# Patient Record
Sex: Male | Born: 1970 | Race: White | Hispanic: No | Marital: Married | Smoking: Never smoker
Health system: Southern US, Community
[De-identification: ages and names within clinical notes are randomized; demographics above are authoritative.]

## PROBLEM LIST (undated history)

## (undated) HISTORY — PX: HAND SURGERY: SHX662

## (undated) HISTORY — PX: VASECTOMY: SHX75

---

## 2010-03-17 LAB — URINALYSIS, ROUTINE W REFLEX MICROSCOPIC
Bilirubin Urine: NEGATIVE
Hgb urine dipstick: NEGATIVE
Specific Gravity, Urine: 1.01 (ref 1.005–1.030)
Urobilinogen, UA: 0.2 mg/dL (ref 0.0–1.0)

## 2010-03-17 LAB — COMPREHENSIVE METABOLIC PANEL
ALT: 21 U/L (ref 0–53)
Alkaline Phosphatase: 51 U/L (ref 39–117)
CO2: 30 mEq/L (ref 19–32)
Calcium: 10 mg/dL (ref 8.4–10.5)
GFR calc non Af Amer: >60 mL/min (ref >60)
Glucose, Bld: 93 mg/dL (ref 70–99)
Potassium: 4.6 mEq/L (ref 3.5–5.1)
Sodium: 139 mEq/L (ref 135–145)

## 2010-03-17 LAB — CBC
HCT: 43.4 % (ref 39.0–52.0)
MCH: 30.8 pg (ref 26.0–34.0)
MCHC: 35.5 g/dL (ref 30.0–36.0)
MCV: 86.8 fL (ref 78.0–100.0)
RDW: 12.6 % (ref 11.5–15.5)
WBC: 6.9 10*3/uL (ref 4.0–10.5)

## 2010-03-17 LAB — PROTIME-INR: Prothrombin Time: 13.3 seconds (ref 11.6–15.2)

## 2010-03-23 ENCOUNTER — Observation Stay (HOSPITAL_COMMUNITY)
Admission: RE | Admit: 2010-03-23 | Discharge: 2010-03-24 | Disposition: A | Discharge status: Home / Self Care | Payer: Managed Care, Other (non HMO) | Attending: Specialist | Admitting: Specialist

## 2010-03-23 ENCOUNTER — Inpatient Hospital Stay (HOSPITAL_COMMUNITY): Payer: Managed Care, Other (non HMO)

## 2010-03-23 ENCOUNTER — Other Ambulatory Visit: Payer: Self-pay | Admitting: Specialist

## 2010-03-23 DIAGNOSIS — Z2239 Carrier of other specified bacterial diseases: Secondary | ICD-10-CM | POA: Insufficient documentation

## 2010-03-23 DIAGNOSIS — IMO0001 Reserved for inherently not codable concepts without codable children: Secondary | ICD-10-CM | POA: Insufficient documentation

## 2010-03-23 DIAGNOSIS — M549 Dorsalgia, unspecified: Secondary | ICD-10-CM | POA: Insufficient documentation

## 2010-03-23 DIAGNOSIS — M4800 Spinal stenosis, site unspecified: Secondary | ICD-10-CM | POA: Insufficient documentation

## 2010-03-23 DIAGNOSIS — Z01818 Encounter for other preprocedural examination: Secondary | ICD-10-CM | POA: Insufficient documentation

## 2010-03-23 DIAGNOSIS — I498 Other specified cardiac arrhythmias: Secondary | ICD-10-CM | POA: Insufficient documentation

## 2010-03-23 DIAGNOSIS — M51379 Other intervertebral disc degeneration, lumbosacral region without mention of lumbar back pain or lower extremity pain: Secondary | ICD-10-CM | POA: Insufficient documentation

## 2010-03-23 DIAGNOSIS — M5137 Other intervertebral disc degeneration, lumbosacral region: Secondary | ICD-10-CM | POA: Insufficient documentation

## 2010-03-23 DIAGNOSIS — M79609 Pain in unspecified limb: Secondary | ICD-10-CM | POA: Insufficient documentation

## 2010-03-23 DIAGNOSIS — M5126 Other intervertebral disc displacement, lumbar region: Principal | ICD-10-CM | POA: Insufficient documentation

## 2010-03-23 DIAGNOSIS — Z2089 Contact with and (suspected) exposure to other communicable diseases: Secondary | ICD-10-CM | POA: Insufficient documentation

## 2010-03-23 HISTORY — PX: BACK SURGERY: SHX140

## 2010-03-28 NOTE — Op Note (Signed)
NAMEELDRIGE, PITKIN           ACCOUNT NO.:  [id number]  MEDICAL RECORD NO.:  [id number]           PATIENT TYPE:  I  LOCATION:  0002                         FACILITY:  [facility]  PHYSICIAN:  Jene Every, M.D.    DATE OF BIRTH:  1970/04/17  DATE OF PROCEDURE:  03/23/2010 DATE OF DISCHARGE:                              OPERATIVE REPORT   PREOPERATIVE DIAGNOSES:  Spinal stenosis and herniated nucleus pulposus, L4-L5, right.  POSTOPERATIVE DIAGNOSES:  Spinal stenosis and herniated nucleus pulposus, L4-L5, right.  PROCEDURE PERFORMED: 1. Microdecompression, L4-5, right. 2. Foraminotomy of L5, right. 3. Microdiskectomy of L4-L5, right. 4. Lysis of epidural venous plexus, L4-L5, right.  ANESTHESIA:  General.  ASSISTANT:  Roma Schanz, P.A.  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  Indications for decompression of the L5 root. He had minimal back pain, predominantly leg pain.  He had disk generation at L4-L5 without instability.  We discussed decompression of the L5 root to reduce his buttock and leg pain.  Risks and benefits were discussed including bleeding, infection, damage to vascular structures, CSF leakage, epidural fibrosis, adjacent segment disease, need for fusion in the future, DVT, PE, anesthetic complications etc.  He is preoperatively positive for staph, negative for MRSA; however, he had contact in a locker room playing hockey.  We therefore felt appropriate to prophylax with clindamycin.  TECHNIQUE:  With the patient in supine position after induction of adequate general anesthesia, 2 grams of Kefzol and 600 mg clindamycin. He was placed prone on the Williamson frame.  All bony prominences were well-padded.  Lumbar region was prepped and draped in the usual  sterile fashion.  An 18-gauge spinal needle was utilized to localize L4-L5 interspace confirmed with x-ray.  Incision was made in the spinous process, L4-L5.  Subcutaneous tissue was dissected.  Electrocautery was utilized to achieve hemostasis.  Dorsolumbar fascia was identified, divided in line with skin incision.  Paraspinous muscle elevated from lamina of L4-L5.  McCullough retractor was placed.  Second confirmatory radiograph obtained with Penfield 4 in the interlaminar space.  Straight curette was utilized to detach ligamentum flavum from the cephalad edge of L5.  Performed a hemilaminotomy in the caudad edge of L4 preserving the pars, detached ligamentum flavum.  Ligamentum flavum removed from the interspace.  Hypertrophic facet was noted as well with the neural elements well protected. Performed foraminotomy of L5, identified the L5 root, gently mobilized it medially knowing a vascular leash tethering it to the foramen of L4.  This was cauterized and divided, improving the mobility of the fibroid.  However, a focal HNP was noted with thecombination of soft and hard disk with an osteophytic ridge of the vertebral endplate of L4.  Performed annulotomy.  Copious portion of disk material was removed from the disk space with straight and upbiting pituitary.  This was sent to pathology.  Further mobilized with a nerve hook and an Epstein.  There was a portion of an osteophytic ridge, however, that remained.  The nerve root, however, was freely  mobile at this point in time and had at least a centimeter of excursion medial to the pedicle without difficulty.  Copiously irrigated disk space with antibiotic irrigation.  Hockey-stick probe passed freely up to foramen L5, L4 cephalad to the pedicle of L4 beneath thecal sac.  Axilla root and the shoulder root were checked, no evidence of neural compressive or herniated disk material.  Inspection revealed no CSF leakage or active bleeding.   Copiously irrigated disk space again, placed bone wax along the cancellous surfaces, removed the McCullough retractor after confirmatory radiographs obtained and Penfield 4 in the interlaminar space.  This confirmed L4-L5.  No CSF leakage or epidural bleeding. Copiously irrigated the paraspinous tissue.  Repaired the dorsolumbar fascia with #1 Vicryl in interrupted figure-of-eight sutures, subcutaneous with 2-0 Vicryl simple sutures.  Skin was reapproximated with 4-0 subcuticular Prolene.  Wound reinforced with Steri-Strips. Sterile dressing applied.  He was placed supine on the hospital bed, extubated without difficulty, and transported to the recovery in satisfactory condition.  The patient tolerated the procedure well, no complications.  BLOOD LOSS:  Minimal.     Jene Every, M.D.     Cordelia Pen  D:  03/23/2010  T:  03/23/2010  Job:  [id number]  Electronically Signed by Jene Every M.D. on 03/28/2010 02:15:33 PM

## 2010-06-27 NOTE — Discharge Summary (Signed)
  [name], Francis Mcdonald           ACCOUNT NO.:  [id number]  MEDICAL RECORD NO.:  [id number]           PATIENT TYPE:  I  LOCATION:  1607                         FACILITY:  [facility]  PHYSICIAN:  Jene Every, M.D.    DATE OF BIRTH:  11-23-1970  DATE OF ADMISSION:  03/23/2010 DATE OF DISCHARGE:  03/24/2010                              DISCHARGE SUMMARY   ADMISSION DIAGNOSIS:  Includes spinal stenosis and herniated nucleus pulposus, L4-L5 on the right.  DISCHARGE DIAGNOSIS:  Includes spinal stenosis and herniated nucleus pulposus, L4-L5 on the right, status post lumbar decompression and microdiskectomy at L4-L5 on the right.  HOSPITAL COURSE:  Uneventful.  DISPOSITION:  The patient stable to be discharged home.  ACTIVITIES:  Ambulate as tolerated utilizing back precautions.  He is to follow up with Dr. Shelle Iron in approximately 10-14 days postoperatively.  DIET:  As tolerated.  MEDICATIONS:  As per med rec sheet.CONDITION ON DISCHARGE:  Stable.  FINAL DIAGNOSIS:  Doing well status post lumbar decompression and microdiskectomy at L4-L5 on the right.     Roma Schanz, P.A.   ______________________________ Jene Every, M.D.    CS/MEDQ  D:  06/07/2010  T:  06/08/2010  Job:  [id number]  Electronically Signed by Roma Schanz P.A. on 06/14/2010 05:53:26 PM Electronically Signed by Jene Every M.D. on 06/27/2010 02:23:15 PM

## 2010-08-31 ENCOUNTER — Ambulatory Visit (INDEPENDENT_AMBULATORY_CARE_PROVIDER_SITE_OTHER): Payer: Managed Care, Other (non HMO) | Admitting: Surgery

## 2010-08-31 ENCOUNTER — Encounter (INDEPENDENT_AMBULATORY_CARE_PROVIDER_SITE_OTHER): Payer: Self-pay | Admitting: Surgery

## 2010-08-31 VITALS — BP 132/86 | HR 78 | Temp 96.2°F | Ht 72.0 in | Wt 206.2 lb

## 2010-08-31 DIAGNOSIS — K429 Umbilical hernia without obstruction or gangrene: Secondary | ICD-10-CM

## 2010-08-31 NOTE — Progress Notes (Signed)
Subjective:     Patient ID: Francis Mcdonald, male   DOB: 07/01/1970, 40 y.o.   MRN: [id number]    BP 132/86  Pulse 78  Temp(Src) 96.2 F (35.7 C) (Temporal)  Ht 6' (1.829 m)  Wt 206 lb 3.2 oz (93.532 kg)  BMI 27.97 kg/m2    HPI The patient presents today with the chief complaint of umbilical hernia. It is not causing any pain currently. He's had it for at least 5 years. He is not getting larger. It is not interfering with his activities of daily life. He denies any nausea vomiting or change in bowel or bladder function. History reviewed. No pertinent past medical history. Past Surgical History  Procedure Date  . Back surgery 03/2010    hern disc   No current outpatient prescriptions on file.   History   Social History  . Marital Status: Married    Spouse Name: N/A    Number of Children: N/A  . Years of Education: N/A   Occupational History  . Not on file.   Social History Main Topics  . Smoking status: Never Smoker   . Smokeless tobacco: Not on file  . Alcohol Use: Yes     occasional  . Drug Use: No  . Sexually Active: Not on file   Other Topics Concern  . Not on file   Social History Narrative  . No narrative on file   No Known Allergies   Review of Systems  Constitutional: Negative.   HENT: Negative.   Eyes: Negative.   Respiratory: Negative.   Cardiovascular: Negative.   Gastrointestinal: Negative.   Genitourinary: Negative.   Musculoskeletal: Negative.   Neurological: Negative.   Hematological: Negative.   Psychiatric/Behavioral: Negative.        Objective:   Physical Exam  Constitutional: He appears well-developed and well-nourished.  HENT:  Head: Normocephalic and atraumatic.  Nose: Nose normal.  Eyes: Conjunctivae and EOM are normal. Pupils are equal, round, and reactive to light.  Neck: Normal range of motion. Neck supple.  Cardiovascular: Normal rate, regular rhythm and normal heart sounds.  Exam reveals no gallop and no friction rub.    No murmur heard. Pulmonary/Chest: Effort normal and breath sounds normal.  Abdominal: Soft. Bowel sounds are normal.       Small reducible umbilical hernia.       Assessment:     Umbilical Hernia asymptomatic    Plan:     The patient has had an umbilical hernia for a number of years and is not causing any symptoms. We discussed options of repair versus observation. At this point in time observation is appropriate.  He will contact me if he begins to develop pain or if it becomes larger.

## 2010-08-31 NOTE — Patient Instructions (Signed)
Return if you develop pain. Otherwise continue your level of activity.

## 2011-10-20 ENCOUNTER — Ambulatory Visit (INDEPENDENT_AMBULATORY_CARE_PROVIDER_SITE_OTHER): Payer: Managed Care, Other (non HMO) | Admitting: Surgery

## 2011-10-20 ENCOUNTER — Encounter (INDEPENDENT_AMBULATORY_CARE_PROVIDER_SITE_OTHER): Payer: Self-pay | Admitting: Surgery

## 2011-10-20 VITALS — BP 124/82 | HR 72 | Resp 16 | Ht 72.0 in | Wt 209.4 lb

## 2011-10-20 DIAGNOSIS — K219 Gastro-esophageal reflux disease without esophagitis: Secondary | ICD-10-CM

## 2011-10-20 MED ORDER — PANTOPRAZOLE SODIUM 20 MG PO TBEC: 20.0000 mg | DELAYED_RELEASE_TABLET | Freq: Every day | ORAL | Status: DC

## 2011-10-20 NOTE — Patient Instructions (Signed)

## 2011-10-20 NOTE — Progress Notes (Signed)
Subjective:     Patient ID: Francis Mcdonald, male   DOB: 10/13/1970, 41 y.o.   MRN: [id number]  HPIpatient returns due to epigastric discomfort experienced over the last few weeks. He was seen one year ago for small multiple hernia that is asymptomatic. He was concerned his pain was related to that. He describes a discomfort in his epigastrium at times is related to eating at times it's not. He describes as a sharp hunger pain. Last night he ate [employer] and this made the pain worse. No nausea or vomiting. No diarrhea or constipation. No blood in the stool. Denies any back pain or significant right upper quadrant pain. He does have significant heartburn which is new at night and also intermittently. He was taking significant amounts of anti-inflammatory medication and antibiotics for infection recently. No diarrhea.   Review of Systems  Respiratory: Negative.   Gastrointestinal: Positive for abdominal pain. Negative for nausea, vomiting, constipation and blood in stool.  Musculoskeletal: Negative.   Psychiatric/Behavioral: Negative.        Objective:   Physical Exam  Constitutional: He is oriented to person, place, and time. He appears well-developed and well-nourished.  HENT:  Head: Normocephalic and atraumatic.  Eyes: EOM are normal. Pupils are equal, round, and reactive to light.  Abdominal: Soft. He exhibits no distension and no mass. There is no tenderness. There is no guarding.    Neurological: He is alert and oriented to person, place, and time.  Skin: Skin is warm and dry.       Assessment:     Gastroesophageal reflux disease. Less likely gastritis.     Plan:     Protonix 20 mg daily. If no improvement or for 2 weeks will refer to gastrology for further workup. He may need abdominal ultrasound if no better. Hernia is stable and requires no  treatment nor is it causing any of his discomfort.

## 2011-10-31 ENCOUNTER — Encounter (INDEPENDENT_AMBULATORY_CARE_PROVIDER_SITE_OTHER): Payer: Managed Care, Other (non HMO) | Admitting: Surgery

## 2012-08-02 ENCOUNTER — Ambulatory Visit (INDEPENDENT_AMBULATORY_CARE_PROVIDER_SITE_OTHER): Payer: Managed Care, Other (non HMO) | Admitting: Surgery

## 2012-08-05 ENCOUNTER — Encounter (INDEPENDENT_AMBULATORY_CARE_PROVIDER_SITE_OTHER): Payer: Self-pay | Admitting: Surgery

## 2012-08-05 ENCOUNTER — Ambulatory Visit (INDEPENDENT_AMBULATORY_CARE_PROVIDER_SITE_OTHER): Payer: Managed Care, Other (non HMO) | Admitting: Surgery

## 2012-08-05 VITALS — BP 110/72 | HR 73 | Temp 98.0°F | Resp 15 | Ht 72.0 in | Wt 213.2 lb

## 2012-08-05 DIAGNOSIS — R109 Unspecified abdominal pain: Secondary | ICD-10-CM

## 2012-08-05 NOTE — Progress Notes (Signed)
Subjective:     Patient ID: Francis Mcdonald, male   DOB: 07-Mar-1970, 42 y.o.   MRN: [id number]  HPIpatient returns due to epigastric discomfort experienced over the last few weeks. He was seen one year ago for small multiple hernia that is asymptomatic. He was concerned his pain was related to that. He describes a discomfort in his epigastrium at times is related to eating at times it's not. He describes as a sharp hunger pain. Last night he ate [employer] and this made the pain worse. No nausea or vomiting. No diarrhea or constipation. No blood in the stool. Denies any back pain or significant right upper quadrant pain. He does have significant heartburn which is new at night and also intermittently. He was taking significant amounts of anti-inflammatory medication and antibiotics for infection recently. No diarrhea.   Review of Systems  Respiratory: Negative.   Gastrointestinal: Positive for abdominal pain. Negative for nausea, vomiting, constipation and blood in stool.  Musculoskeletal: Negative.   Psychiatric/Behavioral: Negative.        Objective:   Physical Exam  Constitutional: He is oriented to person, place, and time. He appears well-developed and well-nourished.  HENT:  Head: Normocephalic and atraumatic.  Eyes: EOM are normal. Pupils are equal, round, and reactive to light.  Abdominal: Soft. He exhibits no distension and no mass. There is no tenderness. There is no guarding. Tender along midline with slight diastasis without hernia.  No rebound.   Neurological: He is alert and oriented to person, place, and time.  Skin: Skin is warm and dry.       Assessment:     Midline abdominal pain with history of umbilical hernia with no enlargement or evidence of incarceration or strangulation of umbilical hernia     Plan:       given discomfort along midline abdominal wall down to umbilicus and no evidence of hernia recommend CT scanning to further evaluate abdominal pain. Recommend  ice and ibuprofen 800 mg every 8 hours as needed for pain. May resume full physical activity once pain has decreased.

## 2012-08-05 NOTE — Patient Instructions (Signed)
ADVIL 800 MG EVERY EIGHT HOURS AS NEEDED FOR PAIN. Rest and ice as needed.  Resume activity as tolerated.  Will check CT to further evaluate pain. Return as needed.

## 2012-08-07 ENCOUNTER — Ambulatory Visit
Admission: RE | Admit: 2012-08-07 | Discharge: 2012-08-07 | Disposition: A | Discharge status: Home / Self Care | Payer: Managed Care, Other (non HMO) | Source: Ambulatory Visit | Attending: Surgery | Admitting: Surgery

## 2012-08-07 DIAGNOSIS — R109 Unspecified abdominal pain: Secondary | ICD-10-CM

## 2012-08-07 MED ORDER — IOHEXOL 300 MG/ML  SOLN
125.0000 mL | Freq: Once | INTRAMUSCULAR | Status: AC | PRN
  Administered 2012-08-07: 125 mL via INTRAVENOUS

## 2012-08-08 ENCOUNTER — Telehealth (INDEPENDENT_AMBULATORY_CARE_PROVIDER_SITE_OTHER): Payer: Self-pay

## 2012-08-08 NOTE — Telephone Encounter (Signed)
Pt notified of CT scan results which show a rectus muscle strain with a small tear.  This should heal with rest and ibuprofen in about two weeks.  CT also showed small inguinal hernia that does not need surgical intervention unless it begins hurting.

## 2013-09-22 ENCOUNTER — Ambulatory Visit (INDEPENDENT_AMBULATORY_CARE_PROVIDER_SITE_OTHER): Payer: Managed Care, Other (non HMO) | Admitting: Surgery

## 2013-09-22 ENCOUNTER — Encounter (INDEPENDENT_AMBULATORY_CARE_PROVIDER_SITE_OTHER): Payer: Self-pay | Admitting: Surgery

## 2013-09-22 VITALS — BP 130/84 | HR 88 | Resp 14 | Ht 72.0 in | Wt 215.4 lb

## 2013-09-22 DIAGNOSIS — K219 Gastro-esophageal reflux disease without esophagitis: Secondary | ICD-10-CM

## 2013-09-22 NOTE — Patient Instructions (Signed)
Hernia is no bigger.  Will refer to GI medicine.

## 2013-09-22 NOTE — Progress Notes (Signed)
Subjective:     Patient ID: Francis DuttonChristopher Mcdonald, male   DOB: 1970-12-19, 43 y.o.   MRN: [id number]  HPI   patient returns due to epigastric discomfort experienced over the last few weeks. Having heartburn symptoms at night which is intermittent.  He was seen one year ago for small umbilical hernia that is asymptomatic. He was concerned his pain was related to that. He describes a discomfort in his epigastrium at times is related to  eating at times and others  it's not. He describes as a sharp hunger pain  And heartburn. Last night he ate tacos and this made the pain worse. No nausea or vomiting. No diarrhea or constipation. No blood in the stool. Denies any significant right upper quadrant pain. He does have significant heartburn which isat night and also intermittently. He was taking significant amounts of anti-inflammatory medication and antibiotics for infection recently. No diarrhea.  On steroids for back pain.    Review of Systems  Respiratory: Negative.   Gastrointestinal: Positive for abdominal pain. Negative for nausea, vomiting, constipation and blood in stool.  Musculoskeletal: Negative.   Psychiatric/Behavioral: Negative.        Objective:   Physical Exam  Constitutional: He is oriented to person, place, and time. He appears well-developed and well-nourished.  HENT:  Head: Normocephalic and atraumatic.  Eyes: EOM are normal. Pupils are equal, round, and reactive to light.  Abdominal: Soft. He exhibits no distension and no mass. There is no tenderness. There is no guarding. Tender along midline with slight diastasis without hernia.  No rebound.   Neurological: He is alert and oriented to person, place, and time.  Skin: Skin is warm and dry.       Assessment:     Midline abdominal pain with history of umbilical hernia with no enlargement or evidence of incarceration or strangulation of umbilical hernia  Heartburn GERD    Plan:       given discomfort along midline abdominal  wall down to umbilicus and no evidence of hernia recommend CT scanning to further evaluate abdominal pain.Will refer to GI medicine for GERD evaluation

## 2013-10-24 ENCOUNTER — Encounter: Payer: Self-pay | Admitting: Surgery

## 2013-12-30 ENCOUNTER — Ambulatory Visit: Payer: Self-pay | Admitting: Cardiology

## 2013-12-31 ENCOUNTER — Encounter: Payer: Self-pay | Admitting: *Deleted

## 2014-01-08 ENCOUNTER — Ambulatory Visit: Payer: Managed Care, Other (non HMO) | Admitting: Cardiology

## 2014-01-27 ENCOUNTER — Ambulatory Visit: Payer: Managed Care, Other (non HMO) | Admitting: Internal Medicine

## 2014-01-30 ENCOUNTER — Ambulatory Visit: Payer: Managed Care, Other (non HMO) | Admitting: Internal Medicine

## 2014-02-18 ENCOUNTER — Ambulatory Visit: Payer: Managed Care, Other (non HMO) | Admitting: Cardiovascular Disease

## 2014-11-15 IMAGING — CT CT ABD-PELV W/ CM
2 of 5 series · 17 of 46 positions shown, 19 images · IV contrast (READICAT/WATER & [ID] OMNI 300)
Comparison: None.

CLINICAL DATA: Abdominal pain, umbilical hernia

CT ABDOMEN AND PELVIS WITH CONTRAST
TECHNIQUE: Multidetector CT imaging of the abdomen and pelvis was
performed following the standard protocol during bolus
administration of intravenous contrast.
Contrast: 125mL OMNIPAQUE IOHEXOL 300 MG/ML  SOLN

[Series 2: abd/pelvis with · axial · 0.79mm/px · z∈[-454,-4]mm · 14 of 101 slices shown, 16 images]
[im 6/101  soft-tissue]
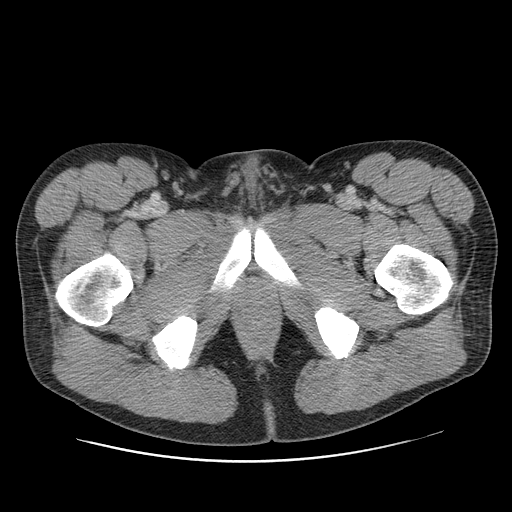
[im 6/101  bone]
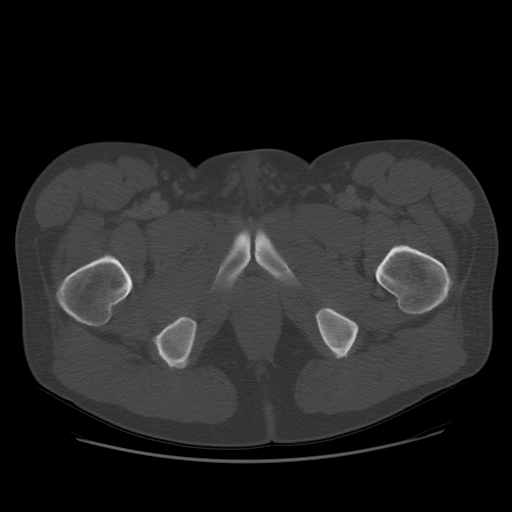
[im 12/101  soft-tissue]
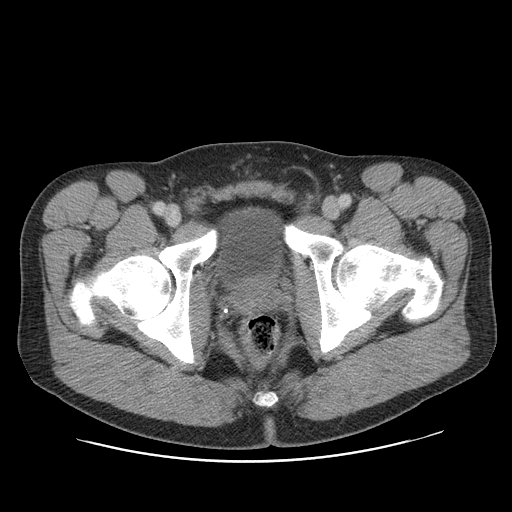
[im 23/101  soft-tissue]
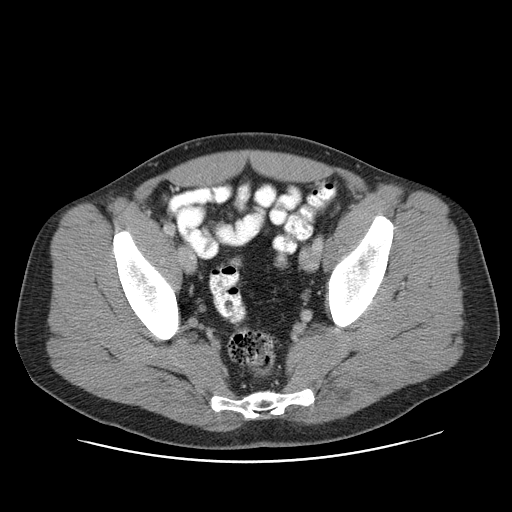
[im 28/101  soft-tissue]
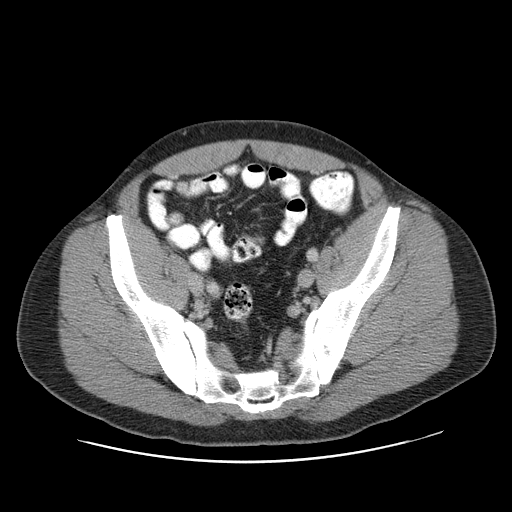
[im 34/101  soft-tissue]
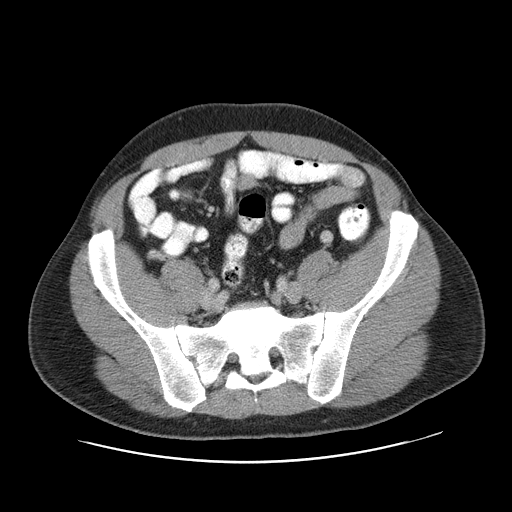
[im 39/101  soft-tissue]
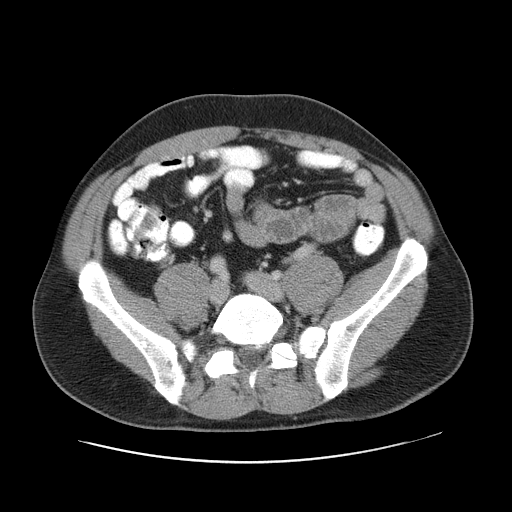
[im 45/101  soft-tissue]
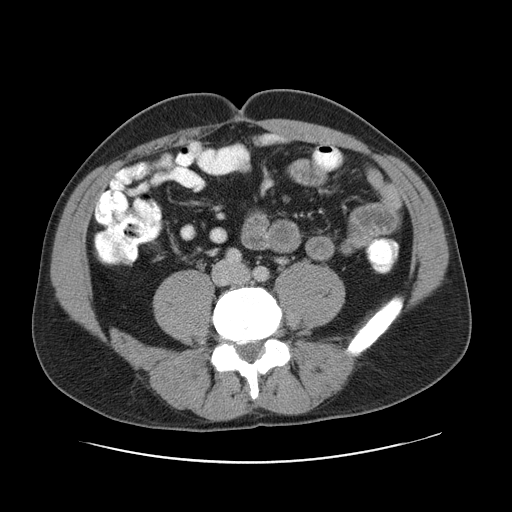
[im 56/101  soft-tissue]
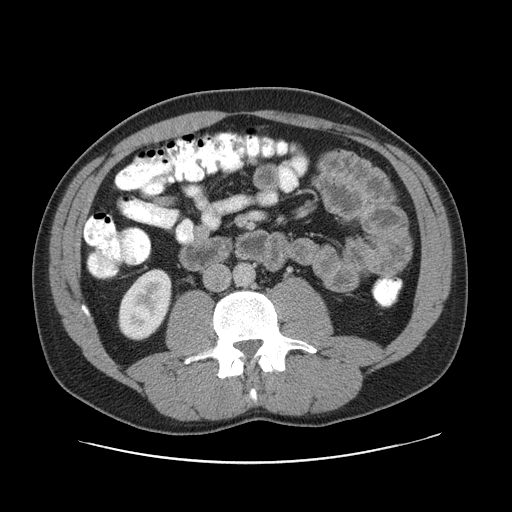
[im 62/101  soft-tissue]
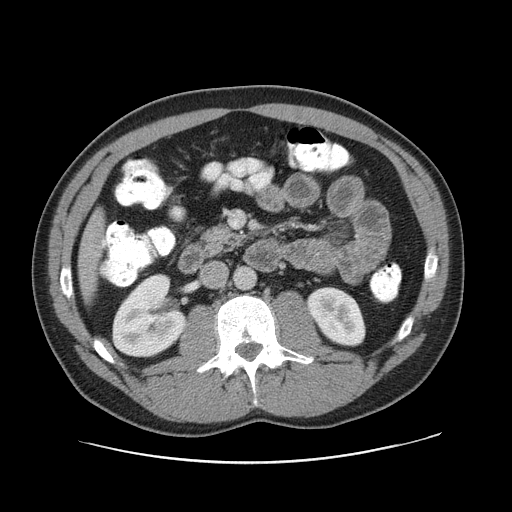
[im 62/101  bone]
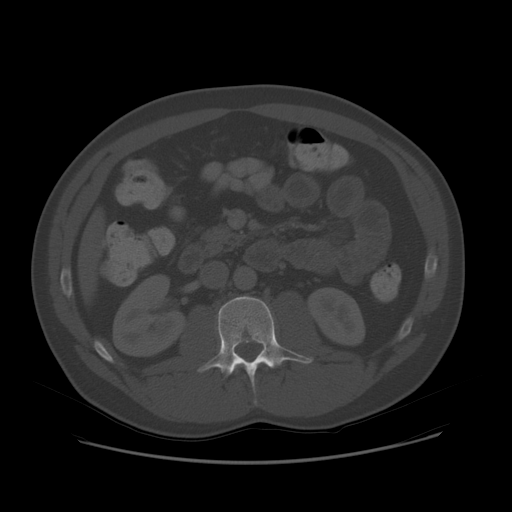
[im 67/101  soft-tissue]
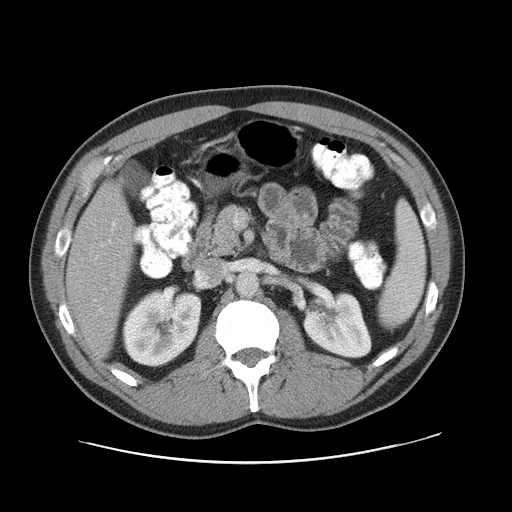
[im 73/101  soft-tissue]
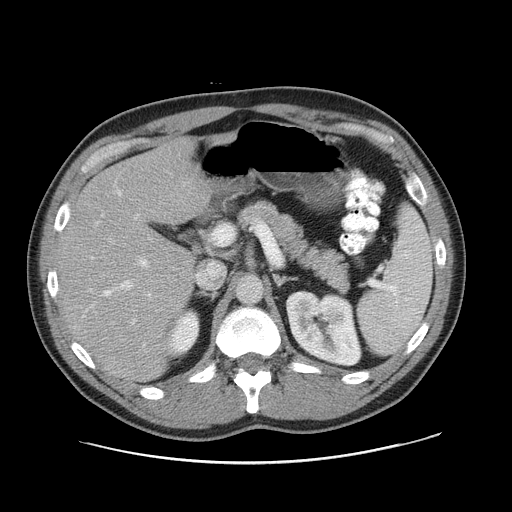
[im 78/101  soft-tissue]
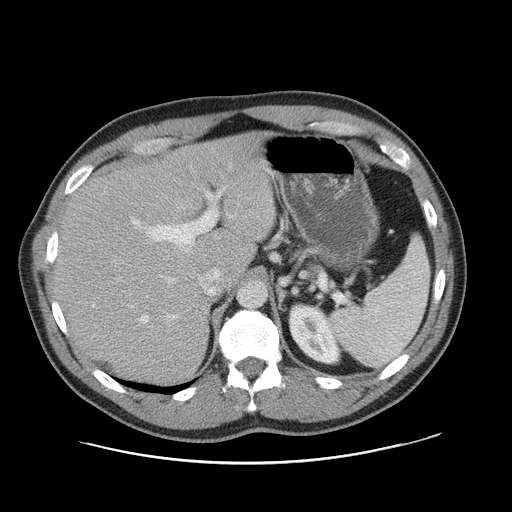
[im 89/101  soft-tissue]
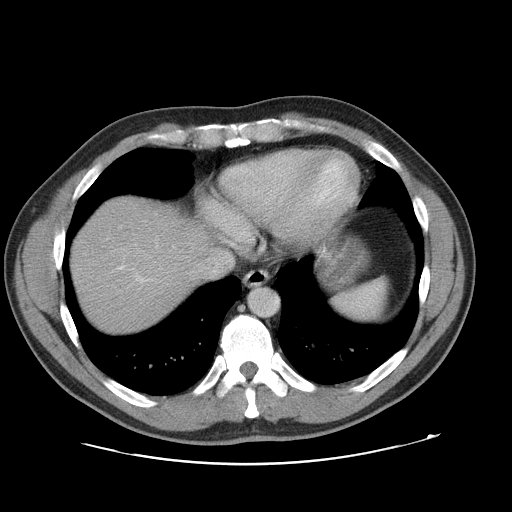
[im 95/101  soft-tissue]
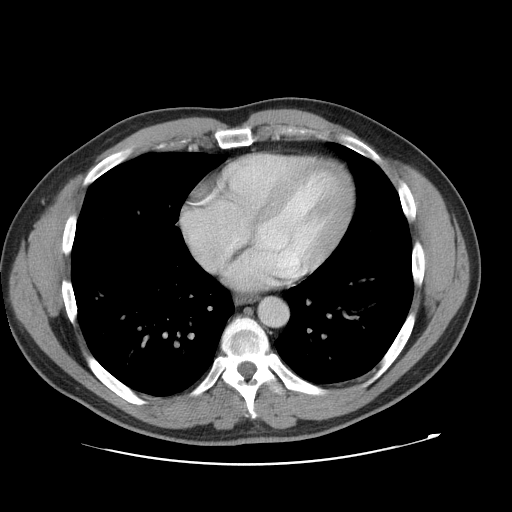

[Series 401: coronal · coronal · 1.05mm/px · 3 of 117 slices shown]
[im 39/117  soft-tissue]
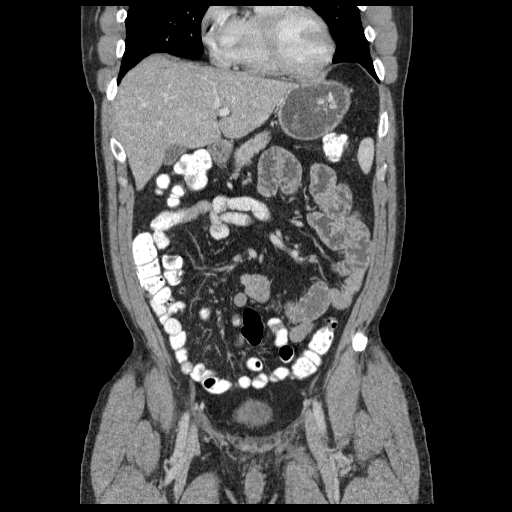
[im 52/117  soft-tissue]
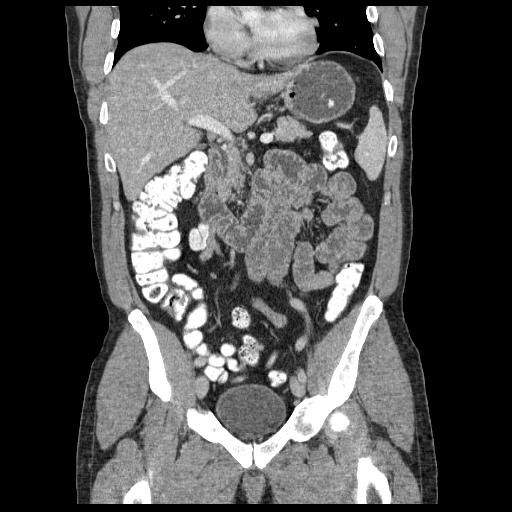
[im 65/117  soft-tissue]
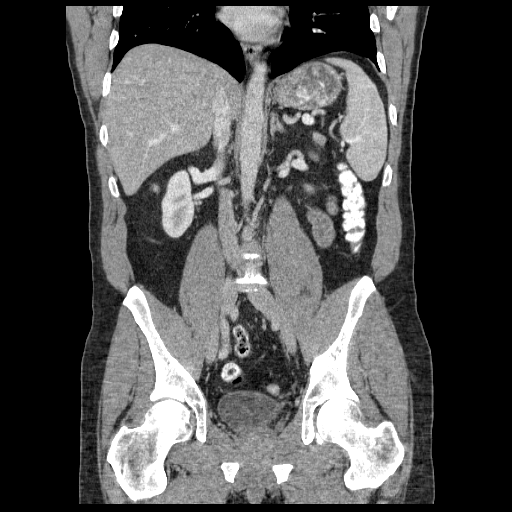

[17 of 46 positions shown; findings below may reference images not displayed]

FINDINGS: Lung bases are unremarkable.

Sagittal images of the spine shows disc space flattening with mild
anterior and mild posterior spurring at L4-L5 level.  No
destructive bony lesions are noted.  Mild hepatic fatty
infiltration.  No focal hepatic mass.  No calcified gallstones are
noted within gallbladder.  The pancreas, spleen and adrenals are
unremarkable.  The kidneys are symmetrical in size and enhancement.
No hydronephrosis or hydroureter.  The delayed renal images shows
bilateral renal symmetrical excretion.  Bilateral visualized
proximal ureter is unremarkable.

No aortic aneurysm.

No small bowel obstruction.  No ascites or free air.  No
adenopathy.

There is no pericecal inflammation.  The terminal ileum is
unremarkable.  Normal appendix is clearly visualized axial image
66.

Prostate gland and  seminal vesicles are unremarkable.  Urinary
bladder is unremarkable.  Small left inguinal canal hernia
containing fat without evidence of acute complication.
IMPRESSION: 1.  No acute inflammatory process within abdomen or pelvis.
2.  No pericecal inflammation.  Normal appendix.
3.  Mild hepatic fatty infiltration.
4.  No aortic aneurysm.
5.  No hydronephrosis or hydroureter.

## 2019-05-15 ENCOUNTER — Ambulatory Visit: Payer: Managed Care, Other (non HMO) | Attending: Internal Medicine

## 2019-05-15 DIAGNOSIS — Z23 Encounter for immunization: Secondary | ICD-10-CM

## 2019-05-15 NOTE — Progress Notes (Signed)
   Covid-[facility] Vaccination [facility]  Name:  Larico Dimock    MRN: [id number] DOB: 04/05/1970  05/15/2019  Mr. Monette was observed post Covid-19 immunization for 15 minutes without incident. He was provided with Vaccine Information Sheet and instruction to access the V-Safe system.   Mr. Swor was instructed to call 911 with any severe reactions post vaccine: Marland Kitchen Difficulty breathing  . Swelling of face and throat  . A fast heartbeat  . A bad rash all over body  . Dizziness and weakness   Immunizations Administered    Name Date Dose VIS Date Route   Pfizer COVID-19 Vaccine 05/15/2019  8:58 AM 0.3 mL 01/31/2019 Intramuscular   Manufacturer: [employer], [employer]   Lot: [id number]   NDC: [address]-2

## 2019-06-11 ENCOUNTER — Ambulatory Visit: Payer: Managed Care, Other (non HMO) | Attending: Internal Medicine

## 2019-06-11 DIAGNOSIS — Z23 Encounter for immunization: Secondary | ICD-10-CM

## 2019-06-11 NOTE — Progress Notes (Signed)
   Covid-19 Vaccination Clinic  Name:  Quinto Tippy    MRN: [id number] DOB: 1970-10-25  06/11/2019  Mr. Brendlinger was observed post Covid-19 immunization for 15 minutes without incident. He was provided with Vaccine Information Sheet and instruction to access the V-Safe system.   Mr. Corsino was instructed to call 911 with any severe reactions post vaccine: Marland Kitchen Difficulty breathing  . Swelling of face and throat  . A fast heartbeat  . A bad rash all over body  . Dizziness and weakness   Immunizations Administered    Name Date Dose VIS Date Route   Pfizer COVID-19 Vaccine 06/11/2019  8:17 AM 0.3 mL 04/16/2018 Intramuscular   Manufacturer: [employer], [employer]   Lot: [id number]   NDC: [address]-0

## 2022-05-10 ENCOUNTER — Other Ambulatory Visit: Payer: Self-pay
